# Patient Record
Sex: Female | Born: 1960 | Race: Black or African American | Hispanic: No | Marital: Single | Smoking: Never smoker
Health system: Southern US, Community
[De-identification: ages and names within clinical notes are randomized; demographics above are authoritative.]

## PROBLEM LIST (undated history)

## (undated) DIAGNOSIS — C801 Malignant (primary) neoplasm, unspecified: Secondary | ICD-10-CM

## (undated) HISTORY — PX: APPENDECTOMY: SHX54

## (undated) HISTORY — PX: ABDOMINAL SURGERY: SHX537

---

## 2019-08-13 ENCOUNTER — Other Ambulatory Visit: Payer: Self-pay

## 2019-08-13 ENCOUNTER — Encounter (HOSPITAL_COMMUNITY): Payer: Self-pay

## 2019-08-13 ENCOUNTER — Ambulatory Visit (INDEPENDENT_AMBULATORY_CARE_PROVIDER_SITE_OTHER): Payer: Self-pay

## 2019-08-13 ENCOUNTER — Ambulatory Visit (HOSPITAL_COMMUNITY)
Admission: EM | Admit: 2019-08-13 | Discharge: 2019-08-13 | Disposition: A | Discharge status: Home / Self Care | Payer: Self-pay | Attending: Family Medicine | Admitting: Family Medicine

## 2019-08-13 DIAGNOSIS — S93402A Sprain of unspecified ligament of left ankle, initial encounter: Secondary | ICD-10-CM

## 2019-08-13 DIAGNOSIS — M25572 Pain in left ankle and joints of left foot: Secondary | ICD-10-CM

## 2019-08-13 HISTORY — DX: Malignant (primary) neoplasm, unspecified: C80.1

## 2019-08-13 NOTE — ED Triage Notes (Signed)
Pt c/o left ankle pain; states she thinks she strained it on March 12 and self-diagnosed after twisting ankle when stepping down from driveway to street. Pt states pain improved with 3 days ice, compression, elevation. But now pain has returned/increased and radiates lateral/medial aspect of calf area. Denies numbness/tingling to toes.

## 2019-08-13 NOTE — Discharge Instructions (Signed)
No concern for any broken bones on your x-ray.  This is most likely really bad sprain.  We will give you a cam walker to wear to have better support.  Keep resting, icing and elevating is much as possible, ibuprofen for pain Follow-up with orthopedic for any continued or worsening problems

## 2019-08-13 NOTE — ED Provider Notes (Signed)
[place]    CSN: [id number] Arrival date & time: 08/13/19  1414      History   Chief Complaint Chief Complaint  Patient presents with  . Ankle Pain    HPI Carol Morgan is a 59 y.o. female.   Patient is a 59 year old female presents today with left ankle pain.  Report initial injury was on March 12 when she stepped off a curb and twisted the ankle.  Reports she rested, iced it for approximately 3 days with some improvement.  Pain now returns with ambulation and reports the pain is lateral, medial and sharp at times.  Denies any numbness, tingling or decreased sensation.  ROS per HPI      Past Medical History:  Diagnosis Date  . Cancer ([place])     There are no problems to display for this patient.   Past Surgical History:  Procedure Laterality Date  . ABDOMINAL SURGERY    . APPENDECTOMY      OB History   No obstetric history on file.      Home Medications    Prior to Admission medications   Not on File    Family History Family History  Problem Relation Age of Onset  . Cancer Mother     Social History Social History   Tobacco Use  . Smoking status: Never Smoker  . Smokeless tobacco: Never Used  Substance Use Topics  . Alcohol use: Never  . Drug use: Never     Allergies   Diprivan [propofol] and Morphine and related   Review of Systems Review of Systems   Physical Exam Triage Vital Signs ED Triage Vitals  Enc Vitals Group     BP 08/13/19 1455 (!) 142/79     Pulse Rate 08/13/19 1455 66     Resp 08/13/19 1455 18     Temp 08/13/19 1455 98.3 F (36.8 C)     Temp Source 08/13/19 1455 Oral     SpO2 08/13/19 1455 100 %     Weight --      Height --      Head Circumference --      Peak Flow --      Pain Score 08/13/19 1450 4     Pain Loc --      Pain Edu? --      Excl. in [place]? --    No data found.  Updated Vital Signs BP (!) 142/79 (BP Location: Right Arm)   Pulse 66   Temp 98.3 F (36.8 C) (Oral)   Resp 18   SpO2  100%   Visual Acuity Right Eye Distance:   Left Eye Distance:   Bilateral Distance:    Right Eye Near:   Left Eye Near:    Bilateral Near:     Physical Exam Vitals and nursing note reviewed.  Constitutional:      General: She is not in acute distress.    Appearance: Normal appearance. She is not ill-appearing, toxic-appearing or diaphoretic.  HENT:     Head: Normocephalic.     Nose: Nose normal.  Eyes:     Conjunctiva/sclera: Conjunctivae normal.  Pulmonary:     Effort: Pulmonary effort is normal.  Musculoskeletal:        General: Normal range of motion.     Cervical back: Normal range of motion.     Comments: No noticeable swelling, erythema or bruising.  Patient has good range of motion. Nontender to palpation of ankle or foot. 2+ pedal  pulse  Skin:    General: Skin is warm and dry.     Findings: No rash.  Neurological:     Mental Status: She is alert.  Psychiatric:        Mood and Affect: Mood normal.      UC Treatments / Results  Labs (all labs ordered are listed, but only abnormal results are displayed) Labs Reviewed - No data to display  EKG   Radiology DG Ankle Complete Left  Result Date: 08/13/2019 CLINICAL DATA:  59 year old female twisted ankle last month with continued pain. EXAM: LEFT ANKLE COMPLETE - 3+ VIEW COMPARISON:  None. FINDINGS: Bone mineralization is within normal limits. Mortise joint alignment preserved. No evidence of joint effusion. Talar dome intact. No acute fracture or dislocation identified. Mild degenerative spurring at the calcaneus. Visible bones of the left foot appear grossly intact. IMPRESSION: Negative. Electronically Signed   By: Genevie Ann M.D.   On: 08/13/2019 15:49    Procedures Procedures (including critical care time)  Medications Ordered in UC Medications - No data to display  Initial Impression / Assessment and Plan / UC Course  I have reviewed the triage vital signs and the nursing notes.  Pertinent labs &  imaging results that were available during my care of the patient were reviewed by me and considered in my medical decision making (see chart for details).     Ankle sprain X ray negative.  Bad sprain, rest, ice, elevate,  Cam walker given here today for better support.  Ibuprofen as needed Contact given for sports med follow up.  Final Clinical Impressions(s) / UC Diagnoses   Final diagnoses:  Sprain of left ankle, unspecified ligament, initial encounter     Discharge Instructions     No concern for any broken bones on your x-ray.  This is most likely really bad sprain.  We will give you a cam walker to wear to have better support.  Keep resting, icing and elevating is much as possible, ibuprofen for pain Follow-up with orthopedic for any continued or worsening problems    ED Prescriptions    None     PDMP not reviewed this encounter.   Loura Halt A, NP 08/15/19 [phone]

## 2020-08-09 IMAGING — DX DG ANKLE COMPLETE 3+V*L*
3 series · 3 of 3 positions shown · non-contrast
Comparison: None.

CLINICAL DATA: 58-year-old female twisted ankle last month with
continued pain.

EXAM:
LEFT ANKLE COMPLETE - 3+ VIEW

[ankle ap]
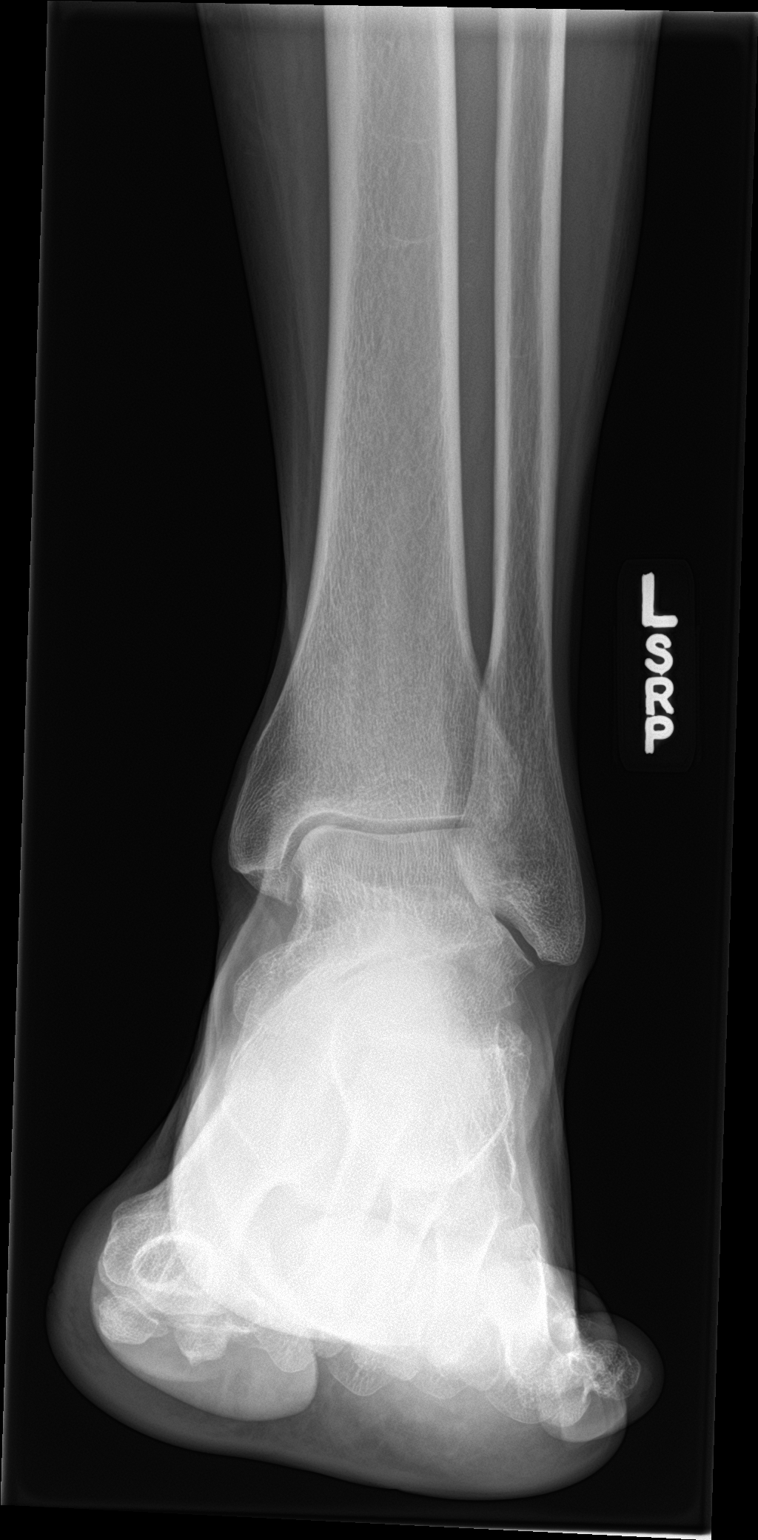

[ankle obl]
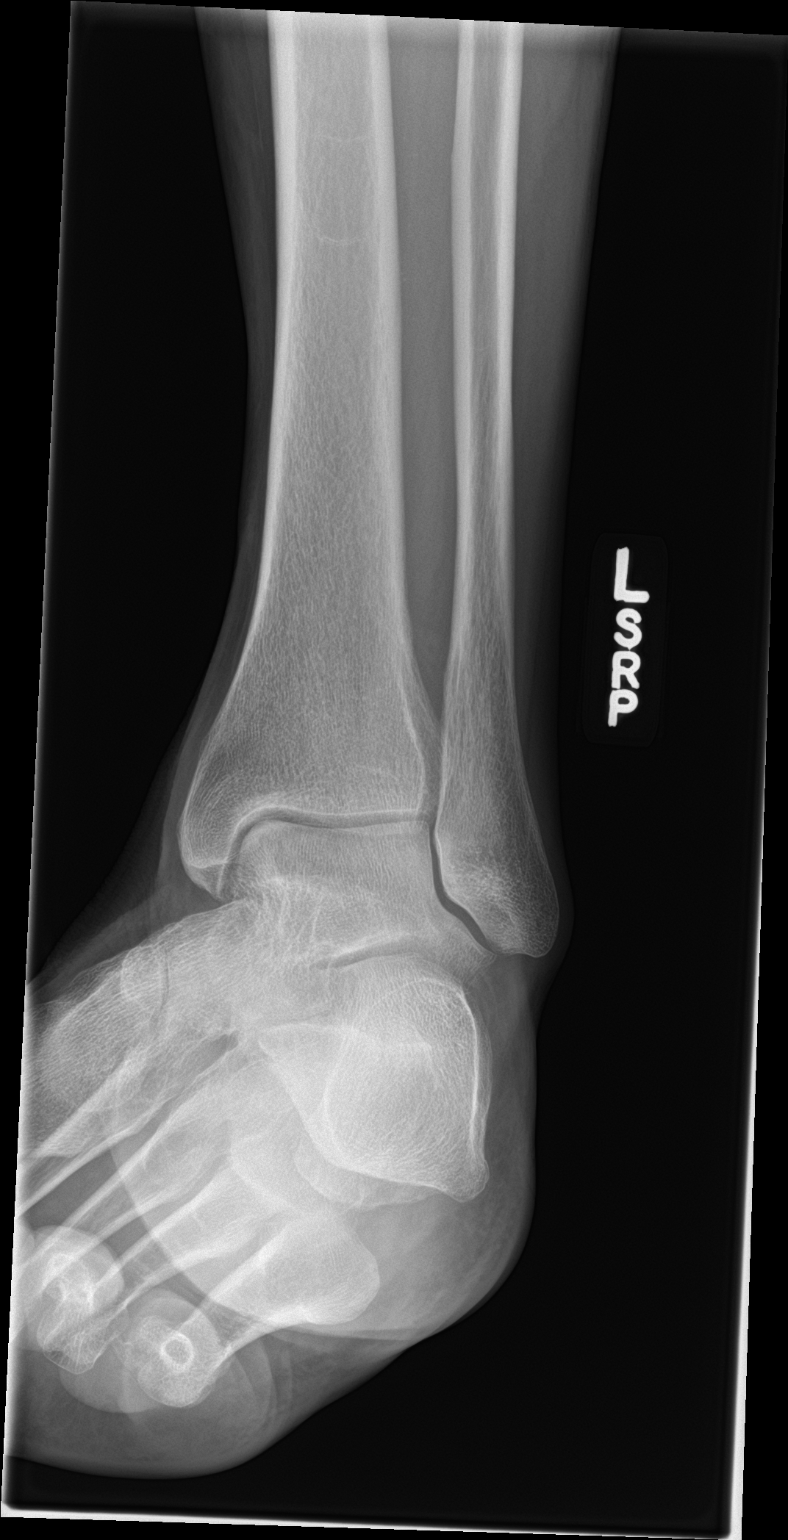

[ankle lat]
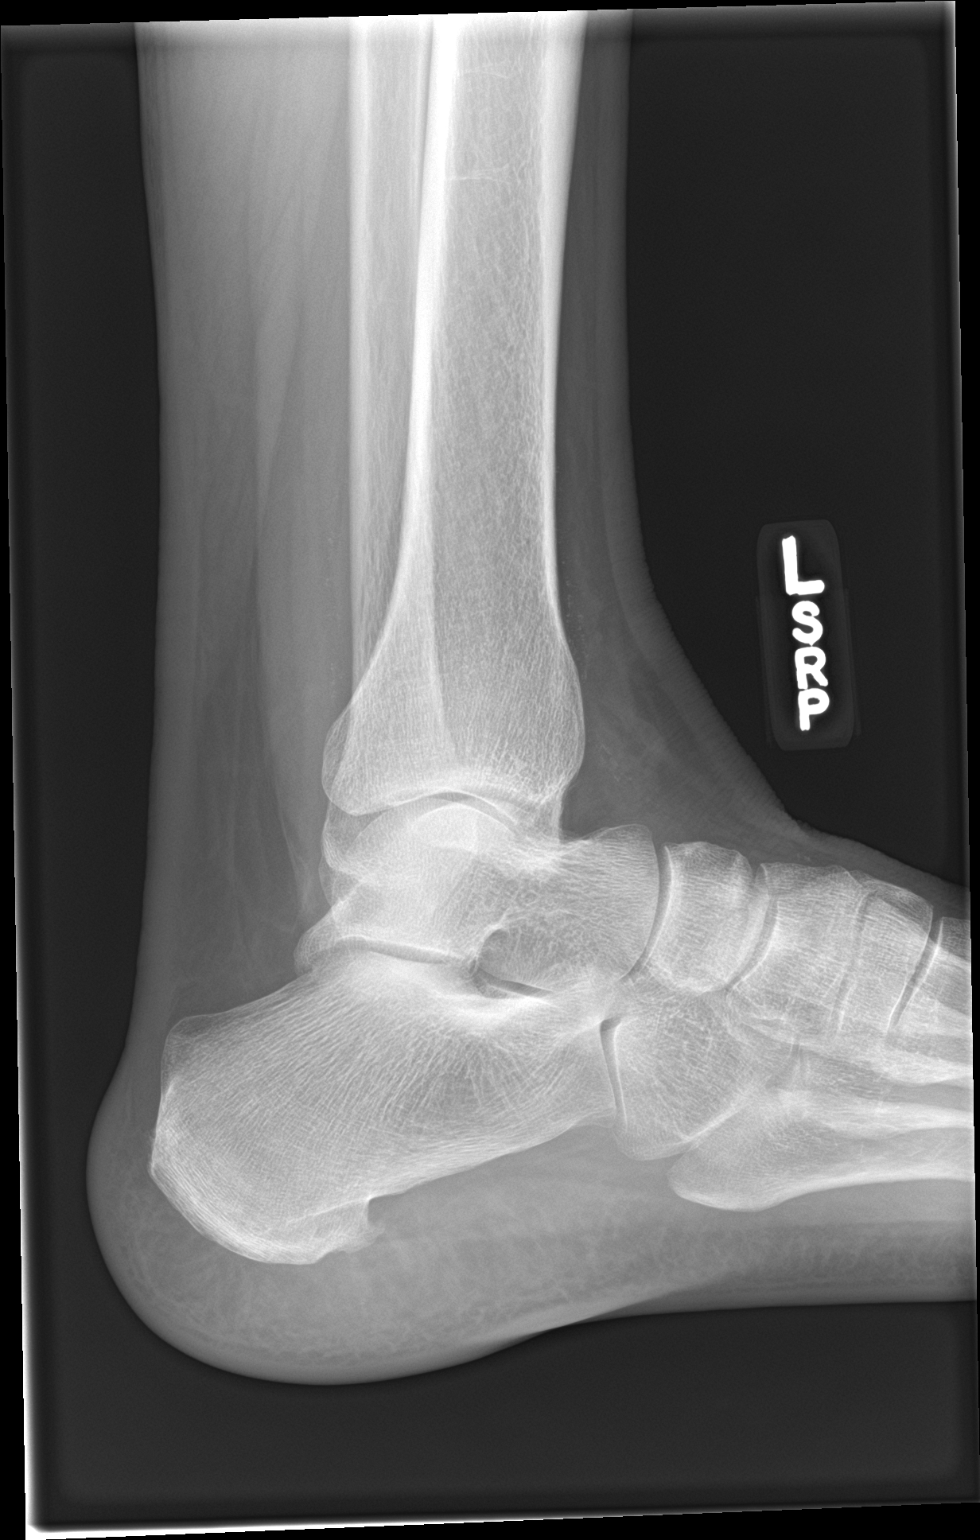

[3 of 3 positions shown; findings below may reference images not displayed]

FINDINGS: Bone mineralization is within normal limits. Mortise joint alignment
preserved. No evidence of joint effusion. Talar dome intact.

No acute fracture or dislocation identified. Mild degenerative
spurring at the calcaneus. Visible bones of the left foot appear
grossly intact.
IMPRESSION: Negative.
